# Patient Record
Sex: Male | Born: 1990 | Race: Black or African American | Hispanic: No | Marital: Married | Smoking: Current every day smoker
Health system: Southern US, Community
[De-identification: ages and names within clinical notes are randomized; demographics above are authoritative.]

## PROBLEM LIST (undated history)

## (undated) DIAGNOSIS — I10 Essential (primary) hypertension: Secondary | ICD-10-CM

## (undated) HISTORY — PX: FOOT SURGERY: SHX648

---

## 2016-11-06 ENCOUNTER — Emergency Department
Admission: EM | Admit: 2016-11-06 | Discharge: 2016-11-06 | Disposition: A | Discharge status: Home / Self Care | Payer: Self-pay | Source: Home / Self Care | Attending: Family Medicine | Admitting: Family Medicine

## 2016-11-06 ENCOUNTER — Encounter: Payer: Self-pay | Admitting: *Deleted

## 2016-11-06 DIAGNOSIS — J039 Acute tonsillitis, unspecified: Secondary | ICD-10-CM

## 2016-11-06 LAB — POCT RAPID STREP A (OFFICE): Rapid Strep A Screen: NEGATIVE

## 2016-11-06 MED ORDER — CLINDAMYCIN HCL 300 MG PO CAPS: 300.0000 mg | ORAL_CAPSULE | Freq: Three times a day (TID) | ORAL | 0 refills | Status: DC

## 2016-11-06 MED ORDER — PREDNISONE 20 MG PO TABS: ORAL_TABLET | ORAL | 0 refills | Status: DC

## 2016-11-06 NOTE — ED Triage Notes (Signed)
Pt c/o all over his body with sore throat x 2 days. He has taken benadryl with minimal relief.

## 2016-11-06 NOTE — Discharge Instructions (Signed)
Try warm salt water gargles for sore throat.  May take Tylenol as needed for pain.  If symptoms become significantly worse during the night or over the weekend, proceed to the local emergency room.  

## 2016-11-06 NOTE — ED Provider Notes (Signed)
[facility]    CSN: [id number] Arrival date & time: 11/06/16  1333     History   Chief Complaint Chief Complaint  Patient presents with  . Sore Throat    HPI Adam Burch is a 25 y.o. male.   Patient complains of onset of a pruritic rash on his legs one week ago that subsequently became generalized.  Five days ago he developed a sore throat that has persisted and sweats at night.  He has had headache but no sinus congestion or cough.  No nausea/vomiting and no abdominal pain.  He states that his urine has been darker than usual.   The history is provided by the patient.  Sore Throat  This is a new problem. Episode onset: 5 daus agp. The problem occurs constantly. The problem has not changed since onset.Associated symptoms include headaches. Pertinent negatives include no abdominal pain. The symptoms are aggravated by swallowing. Nothing relieves the symptoms. Treatments tried: Benadryl. The treatment provided mild relief.    History reviewed. No pertinent past medical history.  There are no active problems to display for this patient.   Past Surgical History:  Procedure Laterality Date  . FOOT SURGERY Right        Home Medications    Prior to Admission medications   Medication Sig Start Date End Date Taking? Authorizing Provider  clindamycin (CLEOCIN) 300 MG capsule Take 1 capsule (300 mg total) by mouth 3 (three) times daily. 11/06/16   Lattie HawStephen A Beese, MD  predniSONE (DELTASONE) 20 MG tablet Take one tab by mouth twice daily for 5 days, then one daily. Take with food. 11/06/16   Lattie HawStephen A Beese, MD    Family History History reviewed. No pertinent family history.  Social History Social History  Substance Use Topics  . Smoking status: Current Every Day Smoker    Packs/day: 0.50    Types: Cigarettes  . Smokeless tobacco: Never Used  . Alcohol use Yes     Allergies   Sulfa antibiotics   Review of Systems Review of Systems    Constitutional: Positive for activity change, chills, diaphoresis and fatigue. Negative for appetite change.  HENT: Positive for sore throat and trouble swallowing. Negative for congestion, facial swelling, mouth sores, rhinorrhea, sinus pain and voice change.   Eyes: Negative.   Respiratory: Negative.   Cardiovascular: Negative.   Gastrointestinal: Negative for abdominal pain.  Genitourinary: Negative.   Musculoskeletal: Negative.   Skin: Positive for rash.  Neurological: Positive for headaches.     Physical Exam Triage Vital Signs ED Triage Vitals  Enc Vitals Group     BP 11/06/16 1351 123/80     Pulse Rate 11/06/16 1351 70     Resp 11/06/16 1351 16     Temp 11/06/16 1351 98.4 F (36.9 C)     Temp Source 11/06/16 1351 Oral     SpO2 11/06/16 1351 100 %     Weight 11/06/16 1353 180 lb (81.6 kg)     Height 11/06/16 1353 5\' 10"  (1.778 m)     Head Circumference --      Peak Flow --      Pain Score 11/06/16 1356 0     Pain Loc --      Pain Edu? --      Excl. in GC? --    No data found.   Updated Vital Signs BP 123/80 (BP Location: Left Arm)   Pulse 70   Temp 98.4 F (36.9 C) (Oral)  Resp 16   Ht 5\' 10"  (1.778 m)   Wt 180 lb (81.6 kg)   SpO2 100%   BMI 25.83 kg/m   Visual Acuity Right Eye Distance:   Left Eye Distance:   Bilateral Distance:    Right Eye Near:   Left Eye Near:    Bilateral Near:     Physical Exam Nursing notes and Vital Signs reviewed. Appearance:  Patient appears stated age, and in no acute distress Eyes:  Pupils are equal, round, and reactive to light and accomodation.  Extraocular movement is intact.  Conjunctivae are not inflamed  Ears:  Canals normal.  Tympanic membranes normal.  Nose:   Normal turbinates.  No sinus tenderness.    Pharynx:  Erythematous and slightly swollen without obstruction.  Uvula erythematous Neck:  Supple.  Tender enlarged tonsillar nodes and posterior/lateral nodes are palpated bilaterally  Lungs:  Clear to  auscultation.  Breath sounds are equal.  Moving air well. Heart:  Regular rate and rhythm without murmurs, rubs, or gallops.  Abdomen:  Mild tenderness over spleen without hepatosplenomegaly.  Bowel sounds are present.  No CVA or flank tenderness.  Extremities:  No edema.  Skin:  Scattered punctate erythematous macules on trunk and extremities.   UC Treatments / Results  Labs (all labs ordered are listed, but only abnormal results are displayed) Labs Reviewed  POCT RAPID STREP A (OFFICE) negative    EKG  EKG Interpretation None       Radiology No results found.  Procedures Procedures (including critical care time)  Medications Ordered in UC Medications - No data to display   Initial Impression / Assessment and Plan / UC Course  I have reviewed the triage vital signs and the nursing notes.  Pertinent labs & imaging results that were available during my care of the patient were reviewed by me and considered in my medical decision making (see chart for details).  Clinical Course   ?false negative strep; ?fusobacter; ?mono Will begin Clindamycin 300mg  TID, and prednisone burst/taper.                     Try warm salt water gargles for sore throat.  May take Tylenol as needed for pain.  If symptoms become significantly worse during the night or over the weekend, proceed to the local emergency room.  Followup with Family Doctor if not improved in one week.      Final Clinical Impressions(s) / UC Diagnoses   Final diagnoses:  Tonsillitis    New Prescriptions New Prescriptions   CLINDAMYCIN (CLEOCIN) 300 MG CAPSULE    Take 1 capsule (300 mg total) by mouth 3 (three) times daily.   PREDNISONE (DELTASONE) 20 MG TABLET    Take one tab by mouth twice daily for 5 days, then one daily. Take with food.     Lattie HawStephen A Beese, MD 11/06/16 1430

## 2016-11-18 ENCOUNTER — Emergency Department
Admission: EM | Admit: 2016-11-18 | Discharge: 2016-11-18 | Disposition: A | Discharge status: Home / Self Care | Payer: Self-pay | Source: Home / Self Care | Attending: Family Medicine | Admitting: Family Medicine

## 2016-11-18 ENCOUNTER — Encounter: Payer: Self-pay | Admitting: Emergency Medicine

## 2016-11-18 DIAGNOSIS — Z711 Person with feared health complaint in whom no diagnosis is made: Secondary | ICD-10-CM

## 2016-11-18 LAB — HIV ANTIBODY (ROUTINE TESTING W REFLEX): HIV 1&2 Ab, 4th Generation: NONREACTIVE

## 2016-11-18 MED ORDER — AZITHROMYCIN 500 MG PO TABS: 1000.0000 mg | ORAL_TABLET | Freq: Once | ORAL | 0 refills | Status: AC

## 2016-11-18 MED ORDER — CEFTRIAXONE SODIUM 250 MG IJ SOLR
250.0000 mg | Freq: Once | INTRAMUSCULAR | Status: AC
Start: 2016-11-18 — End: 2016-11-18
  Administered 2016-11-18: 250 mg via INTRAMUSCULAR
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
## 2016-11-18 NOTE — ED Triage Notes (Signed)
passcode is eddie [phone]
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
## 2016-11-18 NOTE — Discharge Instructions (Signed)
°  Refrain from sexual intercourse for 7 days. Be sure to have all partners tested and treated for STDs.  Practice safe sex by always wearing condoms.  ° °

## 2016-11-19 LAB — URINE CULTURE: Organism ID, Bacteria: NO GROWTH

## 2016-11-21 LAB — GC/CHLAMYDIA PROBE AMP
CT Probe RNA: NOT DETECTED
GC Probe RNA: NOT DETECTED

## 2016-11-21 LAB — RPR

## 2016-11-22 ENCOUNTER — Telehealth: Payer: Self-pay | Admitting: *Deleted

## 2016-11-22 NOTE — Telephone Encounter (Signed)
Attempted to call pt with lab results. No answer and no voicemail

## 2016-11-22 NOTE — Telephone Encounter (Signed)
Pt called back password verified and lab results given. Clemens Catholichristy Yosiah Jasmin, LPN

## 2017-02-21 ENCOUNTER — Emergency Department (HOSPITAL_COMMUNITY)
Admission: EM | Admit: 2017-02-21 | Discharge: 2017-02-21 | Disposition: A | Discharge status: Home / Self Care | Payer: BLUE CROSS/BLUE SHIELD | Attending: Emergency Medicine | Admitting: Emergency Medicine

## 2017-02-21 ENCOUNTER — Encounter (HOSPITAL_COMMUNITY): Payer: Self-pay

## 2017-02-21 ENCOUNTER — Emergency Department (HOSPITAL_COMMUNITY): Payer: BLUE CROSS/BLUE SHIELD

## 2017-02-21 DIAGNOSIS — S066X0A Traumatic subarachnoid hemorrhage without loss of consciousness, initial encounter: Secondary | ICD-10-CM | POA: Diagnosis not present

## 2017-02-21 DIAGNOSIS — Y939 Activity, unspecified: Secondary | ICD-10-CM | POA: Insufficient documentation

## 2017-02-21 DIAGNOSIS — Y9289 Other specified places as the place of occurrence of the external cause: Secondary | ICD-10-CM | POA: Diagnosis not present

## 2017-02-21 DIAGNOSIS — S0101XA Laceration without foreign body of scalp, initial encounter: Secondary | ICD-10-CM

## 2017-02-21 DIAGNOSIS — F17210 Nicotine dependence, cigarettes, uncomplicated: Secondary | ICD-10-CM | POA: Diagnosis not present

## 2017-02-21 DIAGNOSIS — Z79899 Other long term (current) drug therapy: Secondary | ICD-10-CM | POA: Diagnosis not present

## 2017-02-21 DIAGNOSIS — Y999 Unspecified external cause status: Secondary | ICD-10-CM | POA: Diagnosis not present

## 2017-02-21 DIAGNOSIS — S0990XA Unspecified injury of head, initial encounter: Secondary | ICD-10-CM | POA: Diagnosis present

## 2017-02-21 DIAGNOSIS — I609 Nontraumatic subarachnoid hemorrhage, unspecified: Secondary | ICD-10-CM

## 2017-02-21 MED ORDER — MORPHINE SULFATE 30 MG PO TABS: 30.0000 mg | ORAL_TABLET | ORAL | 0 refills | Status: DC | PRN

## 2017-02-21 MED ORDER — BACITRACIN ZINC 500 UNIT/GM EX OINT
TOPICAL_OINTMENT | CUTANEOUS | Status: AC
  Administered 2017-02-21: 05:00:00
  Filled 2017-02-21: qty 0.9

## 2017-02-21 MED ORDER — ACETAMINOPHEN 325 MG PO TABS
650.0000 mg | ORAL_TABLET | Freq: Once | ORAL | Status: AC
  Administered 2017-02-21: 650 mg via ORAL
  Filled 2017-02-21: qty 2

## 2017-02-21 MED ORDER — OXYCODONE HCL 5 MG PO TABS
5.0000 mg | ORAL_TABLET | Freq: Once | ORAL | Status: AC
  Administered 2017-02-21: 5 mg via ORAL
  Filled 2017-02-21: qty 1

## 2017-02-21 NOTE — ED Notes (Signed)
ED Provider at bedside staples applied to laceration. Site cleansed and irrigated with saline and Bacitracin and dressing applied over site.
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
## 2017-02-21 NOTE — Discharge Instructions (Signed)
Please call the neurosurgeon listed to schedule follow up appointment for headache lasting more than 3-4 day, new/worsening symptoms, any concerning symptoms.   Pain medication only as needed for severe pain - This can make you very drowsy - please do not drink alcohol, operate heavy machinery or drive on this medication.  Ice affected area for additional pain relief. Please see attached handout for home care instructions for concussion.    Keep the laceration site dry for the next 24 hours and leave the dressing in place. After 24 hours you may remove the dressing and gently clean the laceration site with antibacterial soap and warm water. Do not scrub the area. Do not soak the area and water for long periods of time. Apply topical bacitracin 1-2 times per day for the next 7 days. Return to the emergency department in 7 days for removal of the staples.  Scalp laceration generally heal very well with minimal risk of infection, however, you should return sooner for any signs of infection which would include increased redness around the wound, increased swelling, new drainage of yellow pus.
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
## 2017-02-23 ENCOUNTER — Encounter: Payer: Self-pay | Admitting: *Deleted

## 2017-02-23 ENCOUNTER — Emergency Department
Admission: EM | Admit: 2017-02-23 | Discharge: 2017-02-23 | Disposition: A | Discharge status: Home / Self Care | Payer: BLUE CROSS/BLUE SHIELD | Source: Home / Self Care | Attending: Family Medicine | Admitting: Family Medicine

## 2017-02-23 ENCOUNTER — Telehealth: Payer: Self-pay | Admitting: *Deleted

## 2017-02-23 DIAGNOSIS — S161XXA Strain of muscle, fascia and tendon at neck level, initial encounter: Secondary | ICD-10-CM

## 2017-02-23 DIAGNOSIS — R030 Elevated blood-pressure reading, without diagnosis of hypertension: Secondary | ICD-10-CM

## 2017-02-23 DIAGNOSIS — M436 Torticollis: Secondary | ICD-10-CM

## 2017-02-23 DIAGNOSIS — M62830 Muscle spasm of back: Secondary | ICD-10-CM

## 2017-02-23 DIAGNOSIS — M542 Cervicalgia: Secondary | ICD-10-CM

## 2017-02-23 HISTORY — DX: Essential (primary) hypertension: I10

## 2017-02-23 MED ORDER — CYCLOBENZAPRINE HCL 10 MG PO TABS: 10.0000 mg | ORAL_TABLET | Freq: Two times a day (BID) | ORAL | 0 refills | Status: DC | PRN

## 2017-02-23 MED ORDER — IBUPROFEN 600 MG PO TABS: 600.0000 mg | ORAL_TABLET | Freq: Four times a day (QID) | ORAL | 0 refills | Status: DC | PRN

## 2017-02-23 MED ORDER — METHYLPREDNISOLONE SODIUM SUCC 40 MG IJ SOLR
80.0000 mg | Freq: Once | INTRAMUSCULAR | Status: AC
  Administered 2017-02-23: 80 mg via INTRAMUSCULAR
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
## 2017-02-23 NOTE — Telephone Encounter (Signed)
Patient reports stiffness has improved. His wife reports he is more alert. Encouraged to take him to the ED if his level of consciousness declines.

## 2017-02-23 NOTE — Discharge Instructions (Signed)
°  You may take 500mg  acetaminophen (Tylenol) every 4-6 hours as needed for pain.  You can take up to  (1g) for the first dose along with 400-600mg  ibuprofen (Motrin or Advil) every 6-8 hours.    Some studies have shown taking acetaminophen and ibuprofen at the same time can help better than taking them separately. Some medications such as Bayer Back and Body and Excedrin have acetaminophen, aspirin, and caffeine all in 1 pill to help with muscle pain even more.   Flexeril (cyclobenzaprine) is a muscle relaxer and may cause drowsiness. Do not drink alcohol, drive, or operate heavy machinery while taking.  You may also find relief in muscle stiffness by alterna ing cool and warm compresses such as ice packs, heating pads, or warm baths.
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
## 2017-02-25 ENCOUNTER — Telehealth: Payer: Self-pay | Admitting: Emergency Medicine

## 2017-02-25 NOTE — Telephone Encounter (Signed)
Could not obtain accurate telephone number to follow up.
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
## 2017-11-08 ENCOUNTER — Encounter: Payer: Self-pay | Admitting: Emergency Medicine

## 2017-11-08 ENCOUNTER — Other Ambulatory Visit: Payer: Self-pay

## 2017-11-08 ENCOUNTER — Emergency Department (INDEPENDENT_AMBULATORY_CARE_PROVIDER_SITE_OTHER): Payer: BLUE CROSS/BLUE SHIELD

## 2017-11-08 ENCOUNTER — Emergency Department
Admission: EM | Admit: 2017-11-08 | Discharge: 2017-11-08 | Disposition: A | Discharge status: Home / Self Care | Payer: BLUE CROSS/BLUE SHIELD | Source: Home / Self Care | Attending: Family Medicine | Admitting: Family Medicine

## 2017-11-08 DIAGNOSIS — M25562 Pain in left knee: Secondary | ICD-10-CM

## 2017-11-08 DIAGNOSIS — S8992XA Unspecified injury of left lower leg, initial encounter: Secondary | ICD-10-CM

## 2017-11-08 MED ORDER — TRAMADOL HCL 50 MG PO TABS: 50.0000 mg | ORAL_TABLET | Freq: Four times a day (QID) | ORAL | 0 refills | Status: DC | PRN

## 2017-11-08 MED ORDER — IBUPROFEN 600 MG PO TABS: 600.0000 mg | ORAL_TABLET | Freq: Once | ORAL | Status: DC
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
## 2017-11-08 NOTE — Discharge Instructions (Signed)
°  You may take 500mg acetaminophen every 4-6 hours or in combination with ibuprofen 400-600mg every 6-8 hours as needed for pain and inflammation. ° °Tramadol is strong pain medication. While taking, do not drink alcohol, drive, or perform any other activities that requires focus while taking these medications.  ° ° °

## 2023-05-17 ENCOUNTER — Ambulatory Visit (INDEPENDENT_AMBULATORY_CARE_PROVIDER_SITE_OTHER): Payer: Self-pay

## 2023-05-17 ENCOUNTER — Ambulatory Visit
Admission: EM | Admit: 2023-05-17 | Discharge: 2023-05-17 | Disposition: A | Discharge status: Home / Self Care | Payer: BLUE CROSS/BLUE SHIELD | Attending: Family Medicine | Admitting: Family Medicine

## 2023-05-17 DIAGNOSIS — M79672 Pain in left foot: Secondary | ICD-10-CM

## 2023-05-17 DIAGNOSIS — S93492A Sprain of other ligament of left ankle, initial encounter: Secondary | ICD-10-CM

## 2023-05-17 MED ORDER — IBUPROFEN 800 MG PO TABS: 800.0000 mg | ORAL_TABLET | Freq: Three times a day (TID) | ORAL | 0 refills | Status: AC
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
## 2023-05-17 NOTE — Discharge Instructions (Signed)
May use ice and elevation to help reduce pain Take ibuprofen 3 times a day with food Wear brace until ankle pain improves.  This needs to be worn inside a lace up shoe or sneaker See orthopedics if you fail to improve

## 2023-07-03 ENCOUNTER — Ambulatory Visit: Payer: 59 | Admitting: Urology

## 2023-07-03 ENCOUNTER — Encounter: Payer: Self-pay | Admitting: Urology

## 2023-07-03 VITALS — BP 150/93 | HR 80 | Ht 70.0 in | Wt 225.0 lb

## 2023-07-03 DIAGNOSIS — Z3009 Encounter for other general counseling and advice on contraception: Secondary | ICD-10-CM

## 2023-07-03 MED ORDER — ALPRAZOLAM 1 MG PO TABS: 1.0000 mg | ORAL_TABLET | Freq: Once | ORAL | 0 refills | Status: AC

## 2023-07-03 NOTE — Progress Notes (Addendum)
   Assessment: 1. Consultation for sterilization      Plan: Today I had a long discussion with the patient regarding vasectomy. As to the procedure, no scalpel technique vasectomy is explained and reviewed in detail.  Generalized risks including but not limited to bleeding, infection, orchalgia, testicular atrophy, epididymitis, scrotal hematoma, and chronic pain are discussed.   Additionally, he understands that the possibility of vas recanalization following vasectomy is possible although rare.  Most importantly, the patient understands that he is not sterile initially and will need a semen analysis check to confirm sterility such that no sperm are seen.  He is advised to avoid ejaculation for 10 days following the procedure.  The initial semen analysis will be checked in approximately 12 weeks and in some patients, several months may be required for clearance of all sperm.  He reports a clear understanding of the need for continued birth control until sterility is confirmed.  Otherwise, general issues regarding local anesthesia, prep, alprazolam are discussed and he reports a clear understanding.   Chief Complaint:  Chief Complaint  Patient presents with   VAS Consult    History of Present Illness:  Adam Burch is a 32 y.o. male who is seen today for vas consult. He has 4 children and is very clear that he does not want any more.  Past Medical History:  Past Medical History:  Diagnosis Date   Hypertension     Past Surgical History:  Past Surgical History:  Procedure Laterality Date   FOOT SURGERY Right     Allergies:  Allergies  Allergen Reactions   Sulfa Antibiotics     Family History:  History reviewed. No pertinent family history.  Social History:  Social History   Tobacco Use   Smoking status: Every Day    Current packs/day: 0.50    Types: Cigarettes   Smokeless tobacco: Never  Substance Use Topics   Alcohol use: Yes   Drug use: No    Review of  symptoms:  Constitutional:  Negative for unexplained weight loss, night sweats, fever, chills ENT:  Negative for nose bleeds, sinus pain, painful swallowing CV:  Negative for chest pain, shortness of breath, exercise intolerance, palpitations, loss of consciousness Resp:  Negative for cough, wheezing, shortness of breath GI:  Negative for nausea, vomiting, diarrhea, bloody stools GU:  Positives noted in HPI; otherwise negative for gross hematuria, dysuria, urinary incontinence Neuro:  Negative for seizures, poor balance, limb weakness, slurred speech Psych:  Negative for lack of energy, depression, anxiety Endocrine:  Negative for polydipsia, polyuria, symptoms of hypoglycemia (dizziness, hunger, sweating) Hematologic:  Negative for anemia, purpura, petechia, prolonged or excessive bleeding, use of anticoagulants  Allergic:  Negative for difficulty breathing or choking as a result of exposure to anything; no shellfish allergy; no allergic response (rash/itch) to materials, foods  Physical exam: There were no vitals taken for this visit. GENERAL APPEARANCE:  Well appearing, well developed, well nourished, NAD   GU:  Normal phallus, testes and cords.  Bilateral vas easily palpable

## 2023-08-02 ENCOUNTER — Telehealth: Payer: Self-pay

## 2023-08-02 NOTE — Telephone Encounter (Signed)
-----   Message from [facility] Tyrina Hines N sent at 07/27/2023 10:20 AM EDT ----- Regarding: vas benefits Call pt; vm not set up and does not have mychart.

## 2023-08-02 NOTE — Telephone Encounter (Signed)
Left msg for patient to return call regarding benefits for procedure.

## 2023-08-06 NOTE — Telephone Encounter (Signed)
3rd attempt: not able to leave a msg for a return call.

## 2023-08-13 ENCOUNTER — Telehealth: Payer: Self-pay

## 2023-08-13 MED ORDER — ALPRAZOLAM 1 MG PO TABS: 1.0000 mg | ORAL_TABLET | Freq: Once | ORAL | 0 refills | Status: AC

## 2023-08-13 NOTE — Telephone Encounter (Signed)
Pt lives in [place] not [place]. Please send to [facility] in [address] [place].

## 2023-08-13 NOTE — Telephone Encounter (Signed)
Pt called to report that this medication he is supposed to take prior to his vasectomy has not been sent to the pharmacy. Please send med today.   [facility] [address] [place]

## 2023-08-13 NOTE — Addendum Note (Signed)
Addended by: Joline Maxcy on: 08/13/2023 10:44 AM   Modules accepted: Orders

## 2023-08-14 ENCOUNTER — Ambulatory Visit: Payer: 59 | Admitting: Urology

## 2023-08-14 VITALS — BP 119/71 | HR 116

## 2023-08-14 DIAGNOSIS — Z302 Encounter for sterilization: Secondary | ICD-10-CM

## 2023-08-14 MED ORDER — CEPHALEXIN 500 MG PO CAPS: 500.0000 mg | ORAL_CAPSULE | Freq: Four times a day (QID) | ORAL | 0 refills | Status: AC

## 2023-08-14 NOTE — Patient Instructions (Signed)
Post Vasectomy After Care This sheet gives you information about how to care for yourself after your procedure.  What can I expect after the procedure? After the procedure, it is common to have: Mild/moderate pain and discomfort. If you have pain or discomfort immediately after the vasectomy, you may use OTC pain medication for relief, ex: Tylenol, Ibuprofen. After the local anesthetic wears off an ice pack may help to provide additional comfort and can also prevent swelling. Do not place ice pack directly on your skin.  Swelling of your scrotum/testicles or or slight redness on your scrotum/testicles around the incision site. Black and Blue bruising as the tissue heals Some slight blood/drainage coming from your incisions or puncture sites for 1 or 2 days. If you have stitches placed they do not need to be removed, they will dissolve on their own after time.  Edges of the incision may come apart and heal slowly, sometimes a knot may be present which can remain for several months. This is normal and part of the healing process Blood in your semen. Follow these instructions at home: Activity For the first 48-72 hours after the procedure, avoid physical activity and exercise that requires heavy lifting. (5-10lbs) Do not take part in sports or perform heavy physical labor until your pain has improved, or until your health care provider says it is okay. You may have limits on the amount of weight you can lift as told by your health care provider. Do not ejaculate for at least 1 week after the procedure, or for as long as you are told. You may resume sexual activity 7-10 days after your procedure, or when your health care provider approves. Use a different method of birth control (contraception) until you have had test results that confirm that there is no sperm in your semen.  Wound Care: Shower only after 24 hours No tub baths, hot tub, or pools for at least 7 days Scrotal support Use scrotal  support, such as a jockstrap or underwear with a supportive pouch, as needed for 1 week after your procedure. If you feel discomfort in your scrotum, you may remove the scrotal support to see if the discomfort is relieved. Sometimes scrotal support can press on the scrotum and cause or worsen discomfort. If your skin gets irritated, you may add some germ-free (sterile), fluffed bandages or a clean washcloth to the scrotal support. Managing pain and swelling If directed, put ice on the affected area. To do this: Put ice in a plastic bag. Place a towel between your skin and the bag. Leave the ice on for 20 minutes, 2-3 times a day. Remove the ice if your skin turns bright red. This is very important. If you cannot feel pain, heat, or cold, you have a greater risk of damage to the area.      Contact a health care provider if: You have pus or a bad smell coming from an incision or puncture site. You have a fever of 101 or greater  Significant drainage or bleeding from the incision site Generalized redness of scrotum   You will be given a specimen cup to bring back a semen sample in 3 months for analysis, will call with results

## 2023-08-14 NOTE — Progress Notes (Signed)
   Assessment: 1. Encounter for sterilization     Plan: Per vas instructions FU 12 weeks for post vas semen analysis  Chief Complaint: No chief complaint on file.   HPI: Adam Burch is a 32 y.o. male who presents for planned vasectomy.   Portions of the above documentation were copied from a prior visit for review purposes only.  Allergies: Allergies  Allergen Reactions   Sulfa Antibiotics     PMH: Past Medical History:  Diagnosis Date   Hypertension     PSH: Past Surgical History:  Procedure Laterality Date   FOOT SURGERY Right     SH: Social History   Tobacco Use   Smoking status: Every Day    Current packs/day: 0.50    Types: Cigarettes   Smokeless tobacco: Never  Substance Use Topics   Alcohol use: Yes   Drug use: No    ROS: Constitutional:  Negative for fever, chills, weight loss CV: Negative for chest pain, previous MI, hypertension Respiratory:  Negative for shortness of breath, wheezing, sleep apnea, frequent cough GI:  Negative for nausea, vomiting, bloody stool, GERD  PE: BP 119/71   Pulse (!) 116  GENERAL APPEARANCE:  Well appearing, well developed, well nourished, NAD    VASECTOMY PROCEDURE:  Adam Burch presents for vasectomy following previous vasectomy consultation and permit is signed.  The patient's anterior scrotal wall is prepped with Betadine in standard sterile fashion.  1% lidocaine is used as local anesthetic in the scrotal and peri vasal tissue.  A standard median raphe punch incision is made and a no scalpel technique vasectomy is performed.  Bilateral vas are isolated from the peri vasal tissue and an approximately 1 cm segment of vas is excised.  Proximal and distal segments are internally cauterized with electric heat cautery. Interposition of perivasal tissue was performed.  Bilateral palpation confirms bilateral vasectomy defect and no significant bleeding or hematoma is identified.  Neosporin gauze dressing and a  scrotal support are applied.    Disposition: Patient is discharged home with Rx for antibiotics.  Patient is given routine vasectomy instructions.   Most importantly, he is instructed and cautioned again regarding the need for protected intercourse until such time that a single  negative semen analysis has been obtained.  The initial semen analysis will be checked in approximately 12  weeks.  The patient reports a clear understanding.  He will call with any interval questions or concerns.

## 2023-08-21 ENCOUNTER — Encounter: Payer: Self-pay | Admitting: Urology

## 2023-10-31 ENCOUNTER — Encounter: Payer: Self-pay | Admitting: Urology

## 2023-11-07 ENCOUNTER — Other Ambulatory Visit: Payer: 59

## 2023-11-07 DIAGNOSIS — Z302 Encounter for sterilization: Secondary | ICD-10-CM

## 2023-11-08 LAB — POST-VAS SPERM EVALUATION,QUAL: Volume: 2.3 mL

## 2024-01-11 ENCOUNTER — Other Ambulatory Visit: Payer: Self-pay

## 2024-01-11 DIAGNOSIS — Z302 Encounter for sterilization: Secondary | ICD-10-CM

## 2024-01-14 ENCOUNTER — Other Ambulatory Visit: Payer: 59

## 2024-01-14 ENCOUNTER — Other Ambulatory Visit: Payer: Self-pay

## 2024-01-14 DIAGNOSIS — Z302 Encounter for sterilization: Secondary | ICD-10-CM

## 2024-01-15 ENCOUNTER — Encounter: Payer: Self-pay | Admitting: Urology

## 2024-01-15 LAB — POST-VAS SPERM EVALUATION,QUAL: Volume: 2.1 mL

## 2024-01-16 ENCOUNTER — Encounter: Payer: Self-pay | Admitting: Urology
